# Patient Record
Sex: Male | Born: 1985 | Race: White | Hispanic: No | Marital: Married | Smoking: Never smoker
Health system: Southern US, Community
[De-identification: ages and names within clinical notes are randomized; demographics above are authoritative.]

---

## 2016-10-23 ENCOUNTER — Emergency Department (HOSPITAL_COMMUNITY)
Admission: EM | Admit: 2016-10-23 | Discharge: 2016-10-24 | Disposition: A | Discharge status: Home / Self Care | Attending: Emergency Medicine | Admitting: Emergency Medicine

## 2016-10-23 ENCOUNTER — Emergency Department (HOSPITAL_COMMUNITY)

## 2016-10-23 ENCOUNTER — Encounter (HOSPITAL_COMMUNITY): Payer: Self-pay

## 2016-10-23 DIAGNOSIS — Y9302 Activity, running: Secondary | ICD-10-CM | POA: Insufficient documentation

## 2016-10-23 DIAGNOSIS — Y999 Unspecified external cause status: Secondary | ICD-10-CM | POA: Insufficient documentation

## 2016-10-23 DIAGNOSIS — W458XXA Other foreign body or object entering through skin, initial encounter: Secondary | ICD-10-CM | POA: Insufficient documentation

## 2016-10-23 DIAGNOSIS — S71141A Puncture wound with foreign body, right thigh, initial encounter: Secondary | ICD-10-CM

## 2016-10-23 DIAGNOSIS — S71131A Puncture wound without foreign body, right thigh, initial encounter: Secondary | ICD-10-CM | POA: Diagnosis not present

## 2016-10-23 DIAGNOSIS — Z23 Encounter for immunization: Secondary | ICD-10-CM | POA: Diagnosis not present

## 2016-10-23 DIAGNOSIS — Y929 Unspecified place or not applicable: Secondary | ICD-10-CM | POA: Insufficient documentation

## 2016-10-23 MED ORDER — LIDOCAINE-EPINEPHRINE (PF) 2 %-1:200000 IJ SOLN
10.0000 mL | Freq: Once | INTRAMUSCULAR | Status: AC
  Administered 2016-10-23: 10 mL via INTRADERMAL
  Filled 2016-10-23: qty 20

## 2016-10-23 MED ORDER — IBUPROFEN 800 MG PO TABS
800.0000 mg | ORAL_TABLET | Freq: Once | ORAL | Status: AC
  Administered 2016-10-23: 800 mg via ORAL
  Filled 2016-10-23: qty 1

## 2016-10-23 MED ORDER — TETANUS-DIPHTH-ACELL PERTUSSIS 5-2.5-18.5 LF-MCG/0.5 IM SUSP
0.5000 mL | Freq: Once | INTRAMUSCULAR | Status: AC
  Administered 2016-10-23: 0.5 mL via INTRAMUSCULAR
  Filled 2016-10-23: qty 0.5
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
## 2016-10-24 MED ORDER — TRAMADOL HCL 50 MG PO TABS: 50.0000 mg | ORAL_TABLET | Freq: Four times a day (QID) | ORAL | 0 refills | Status: AC | PRN

## 2016-10-24 MED ORDER — LEVOFLOXACIN 750 MG PO TABS: 750.0000 mg | ORAL_TABLET | Freq: Every day | ORAL | 0 refills | Status: AC

## 2016-10-24 MED ORDER — CLINDAMYCIN HCL 150 MG PO CAPS: 150.0000 mg | ORAL_CAPSULE | Freq: Four times a day (QID) | ORAL | 0 refills | Status: AC

## 2016-10-24 NOTE — Discharge Instructions (Signed)
Please take antibiotics as prescribe for the full duration.  Eat yogurt high in pro biotic to decrease risk of antibiotic associated diarrhea.  Take pain medication as needed but be aware of risk of drowsiness.  There's always a chance that there are left over wood splinter/debris that are retained.  Return if you notice signs of infection.

## 2018-12-10 IMAGING — CR DG FEMUR 2+V*R*
4 series · 4 of 4 positions shown · non-contrast
Comparison: None.

CLINICAL DATA: Wound to the lateral aspect of the distal right
femur after fall last evening.

EXAM:
RIGHT FEMUR 2 VIEWS

[femur ap (1 of 2)]
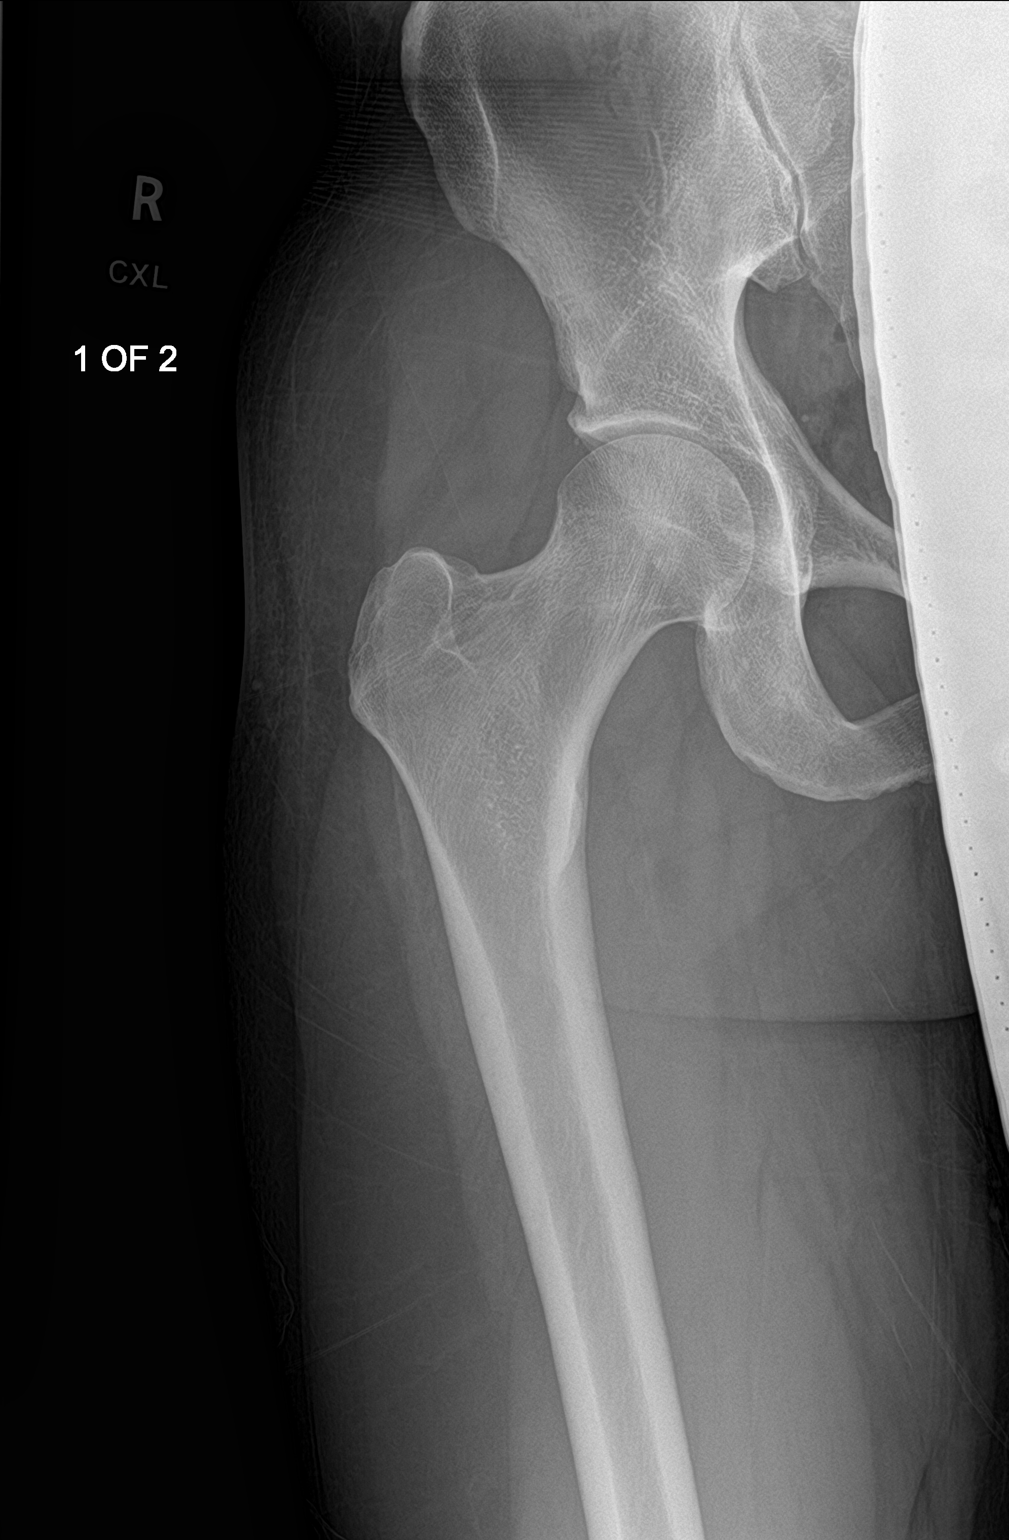

[femur ap (2 of 2)]
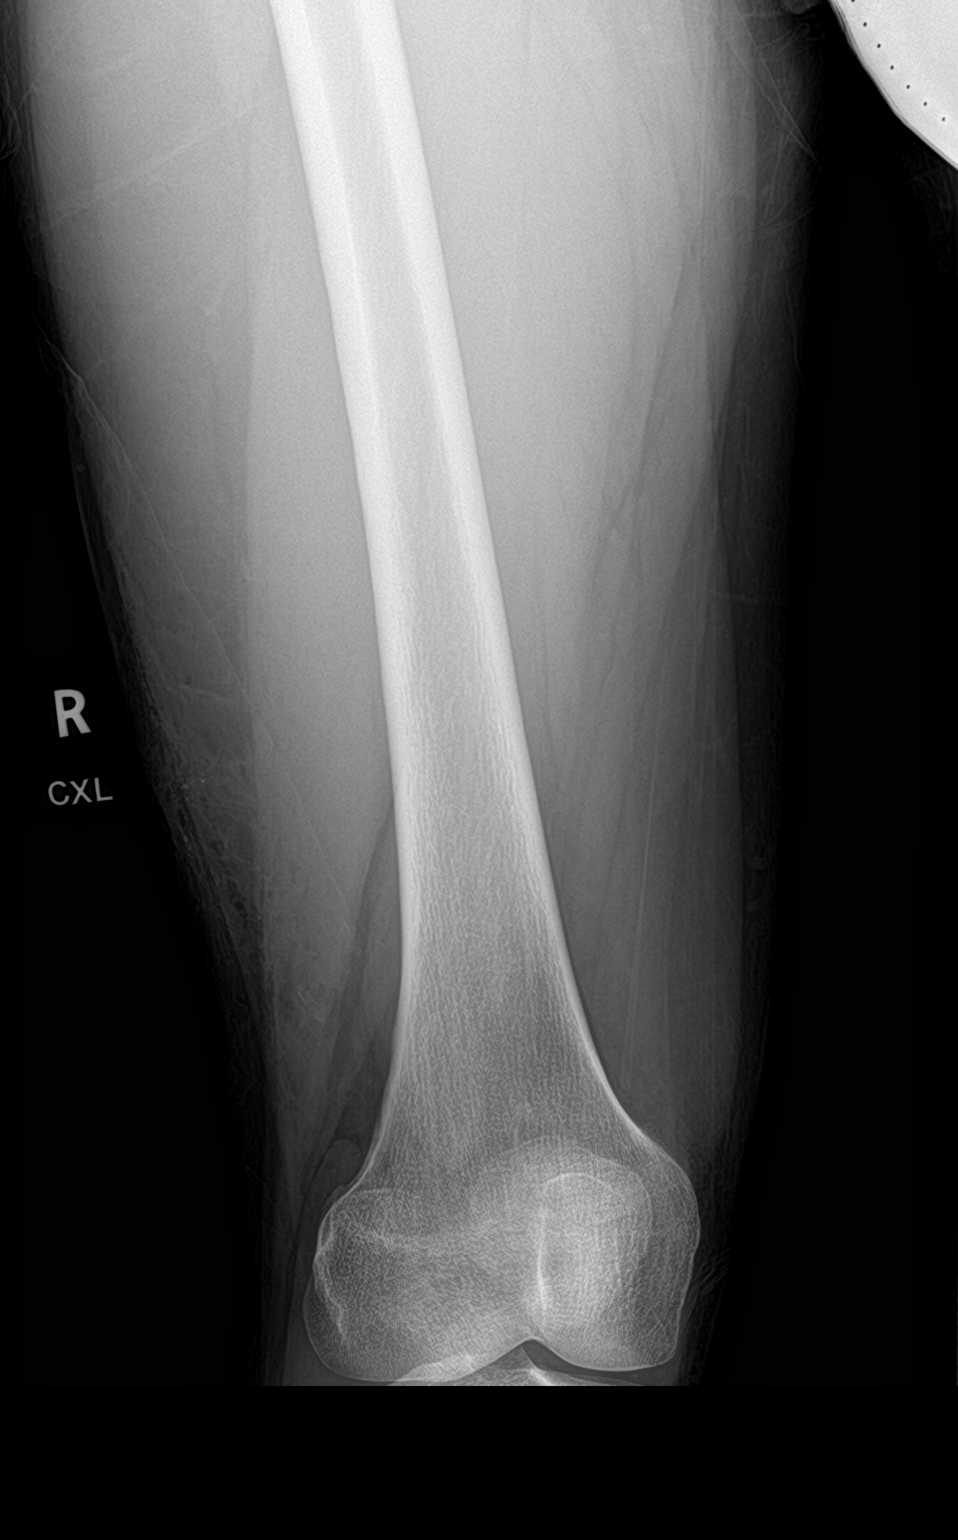

[femur lat (1 of 2)]
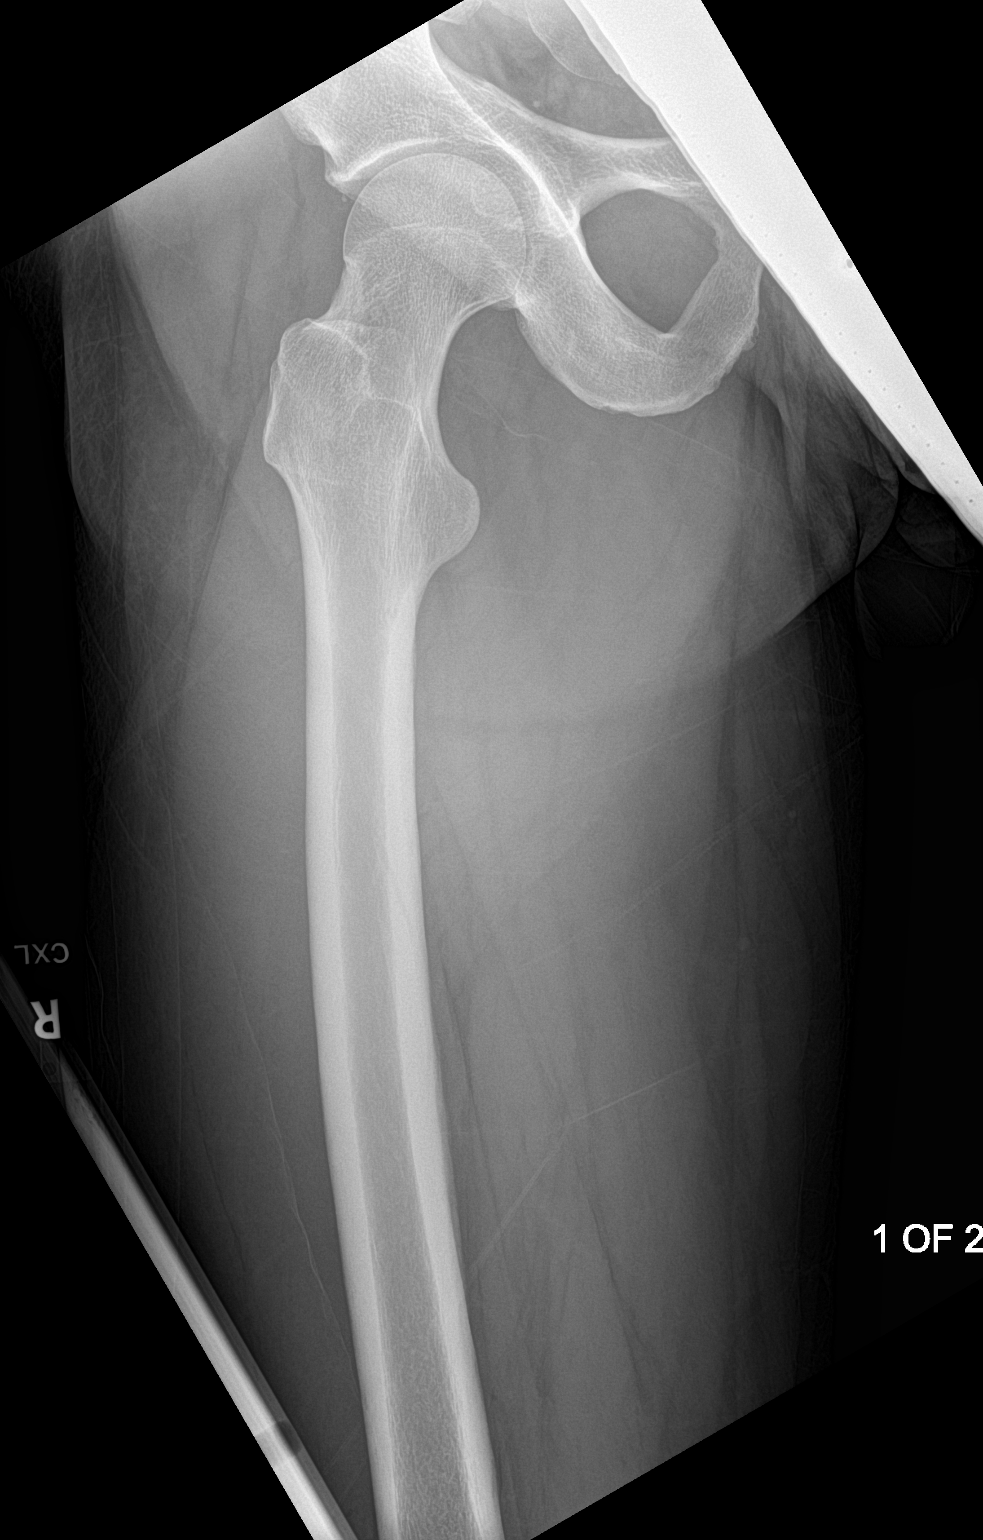

[femur lat (2 of 2)]
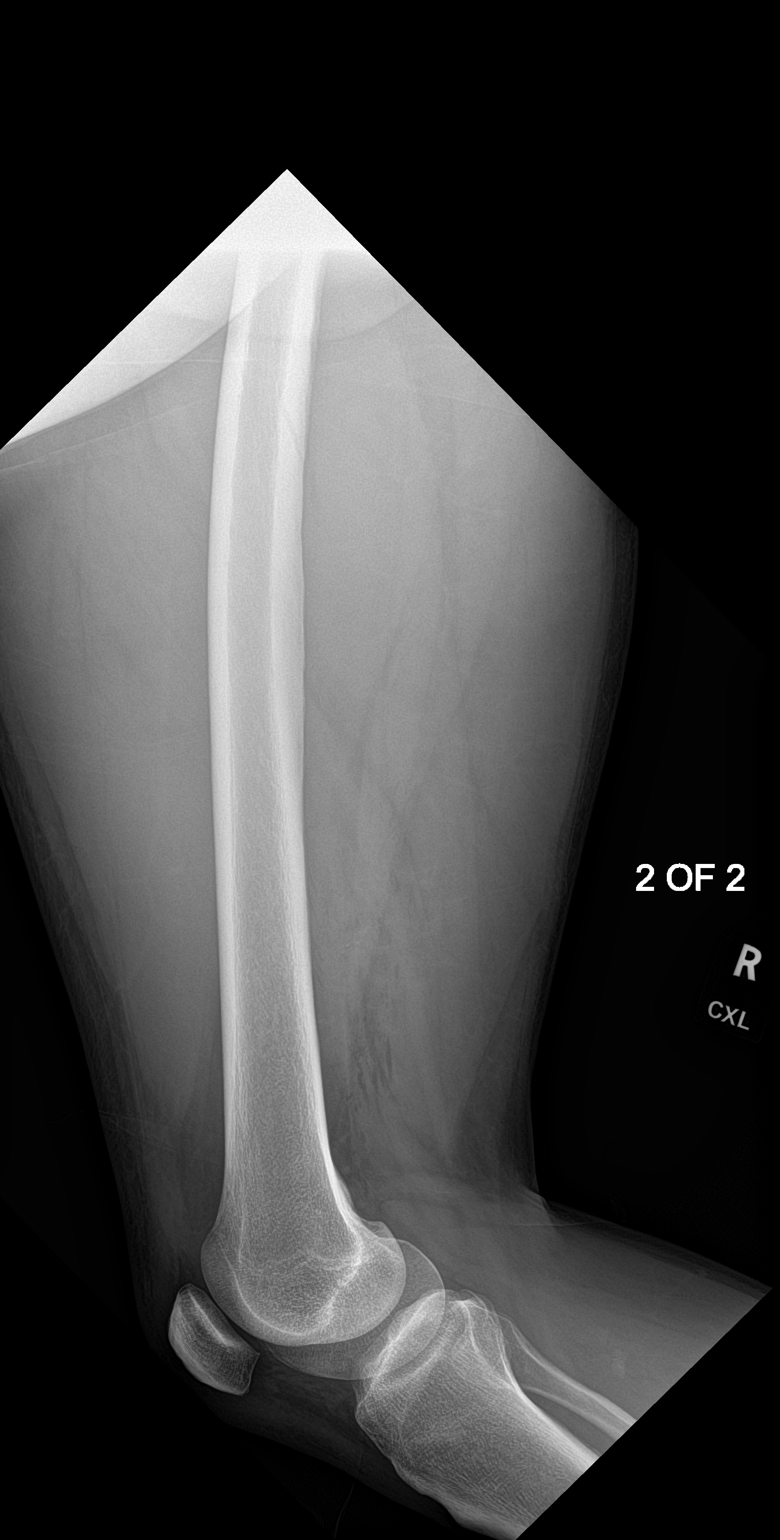

[4 of 4 positions shown; findings below may reference images not displayed]

FINDINGS: Subcutaneous emphysema along the lateral aspect of the right thigh
and with a few scattered up to 5 punctate soft tissue densities
consistent with soft tissue debris, the largest measuring 2-3 mm. No
underlying bony involvement is noted. No fracture or malalignment of
the right femur is seen. Joint spaces at the hip and knee are
maintained.
IMPRESSION: Subcutaneous emphysema consistent with soft tissue laceration along
the distal lateral right thigh with punctate soft tissue debris
noted.
